# Patient Record
Sex: Female | Born: 1955 | Race: White | Hispanic: No | Marital: Married
Health system: Southern US, Community
[De-identification: ages and names within clinical notes are randomized; demographics above are authoritative.]

---

## 2011-09-22 ENCOUNTER — Ambulatory Visit: Payer: Self-pay | Admitting: Family Medicine

## 2012-11-04 IMAGING — US US EXTREM LOW VENOUS*R*
1 series · 14 of 24 positions shown · non-contrast
Comparison: none

REASON FOR EXAM: STAT CR [id number] Right leg pain and swelling Eval
for Bakers cyst of DVT
COMMENTS:

PROCEDURE:     CLE[facility] - CLELAND DOPPLER LOW EXTR RIGHT  - September 22, 2011  [DATE]
RESULT:     Comparison: None

[Series 1: us extrem low venous*right* · 0.09mm/px · 14 of 30 slices shown]
[im 1/30]
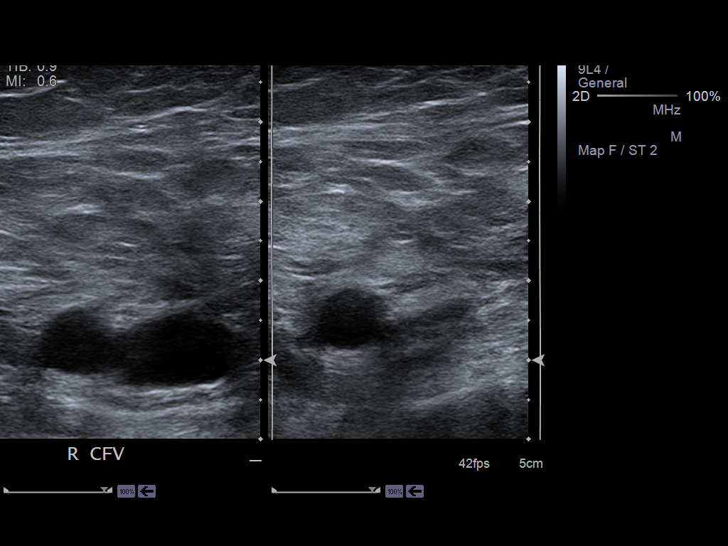
[im 3/30]
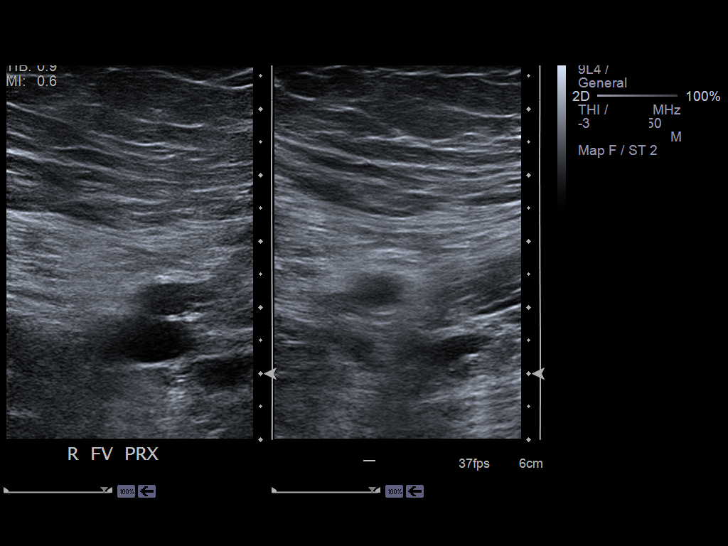
[im 6/30]
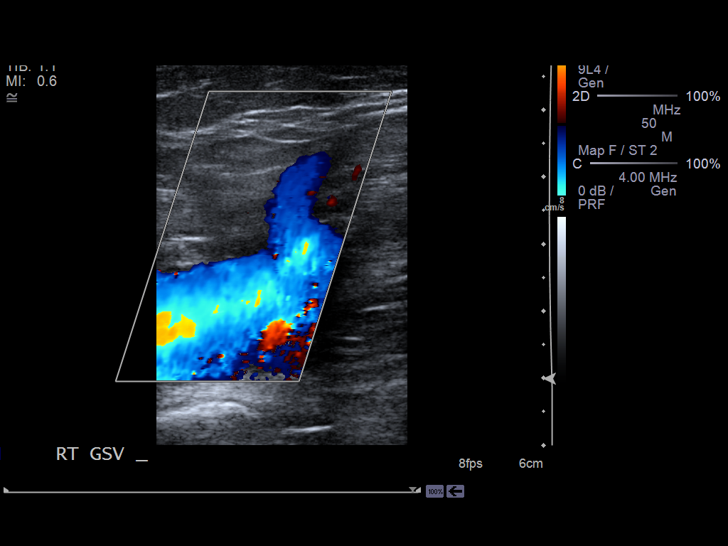
[im 8/30]
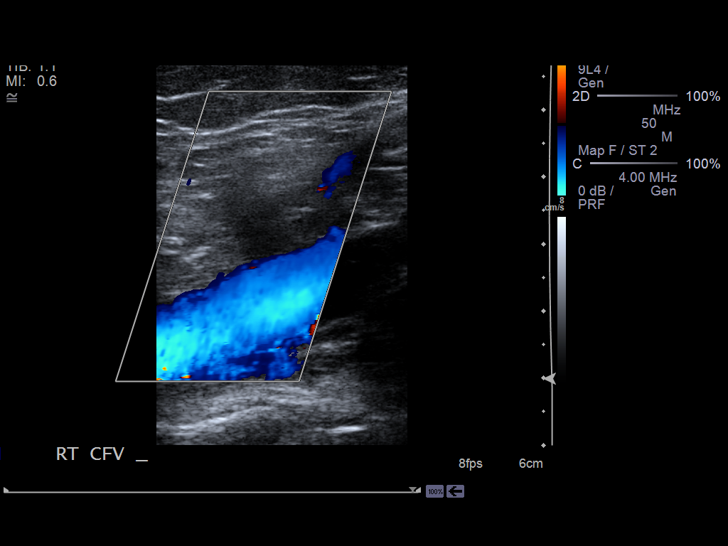
[im 9/30]
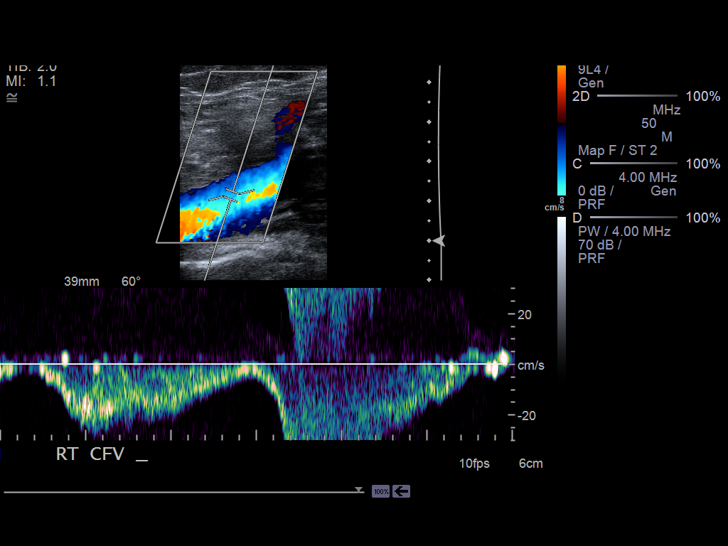
[im 12/30]
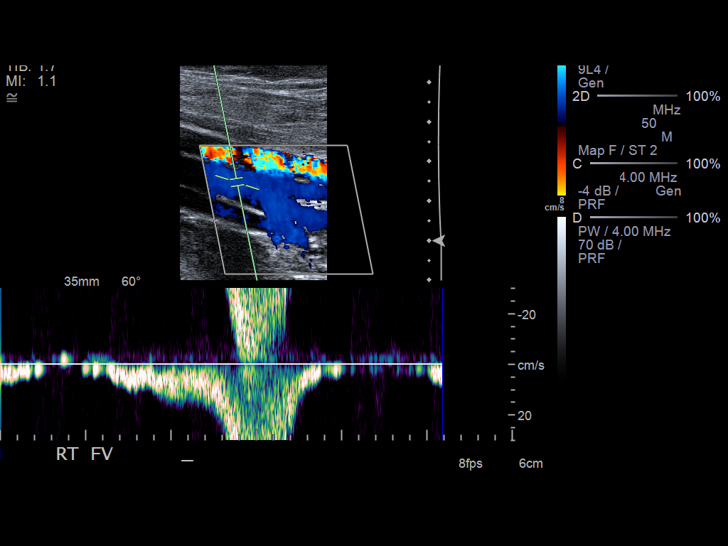
[im 14/30]
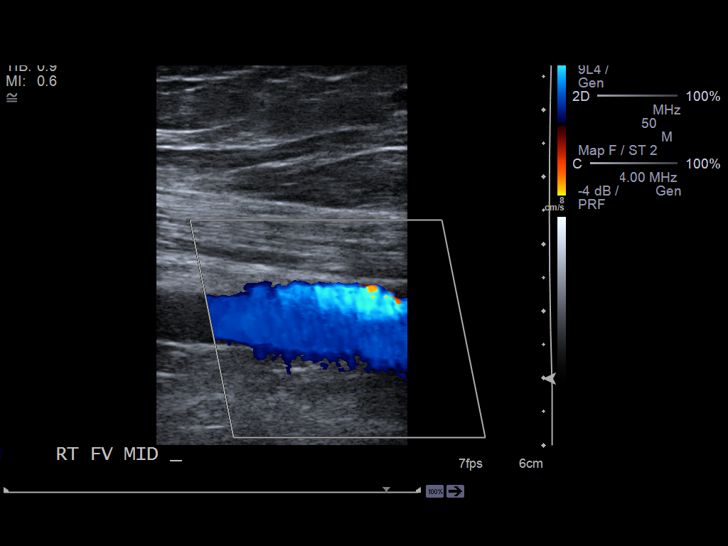
[im 16/30]
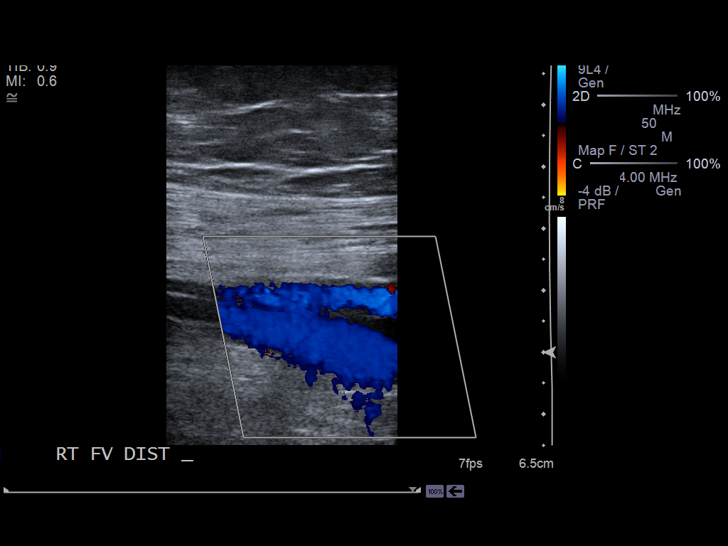
[im 18/30]
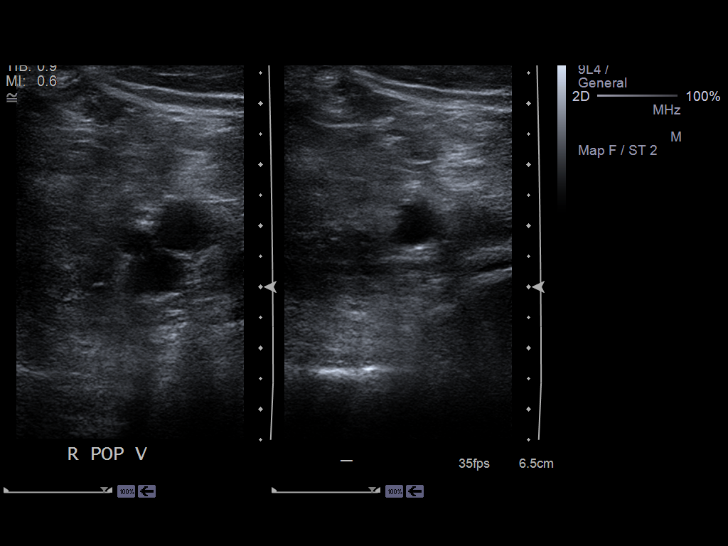
[im 21/30]
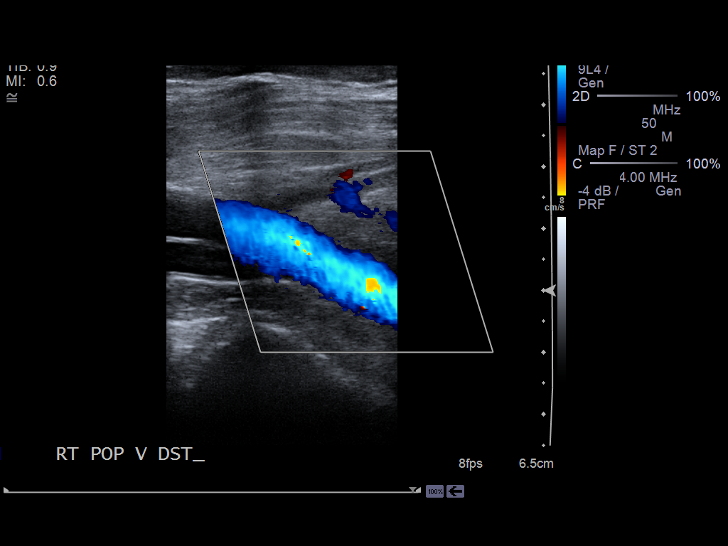
[im 23/30]
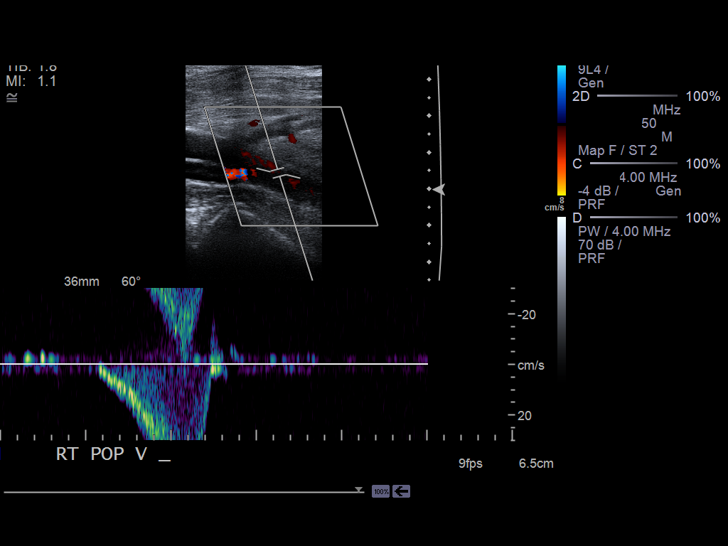
[im 24/30]
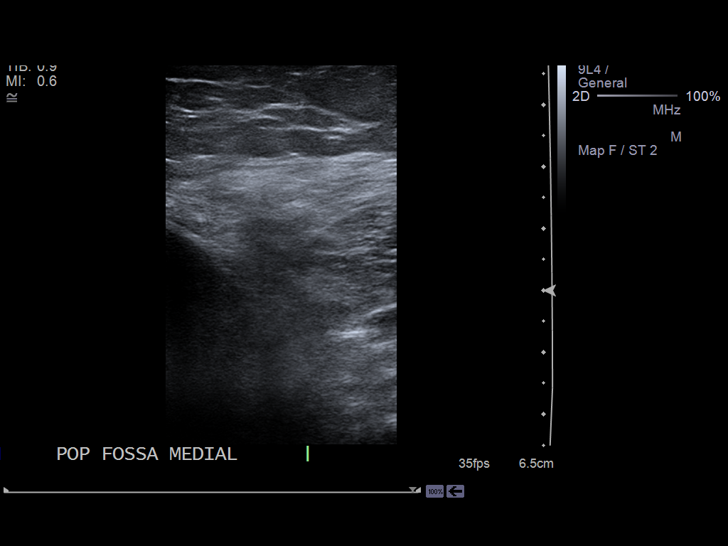
[im 27/30]
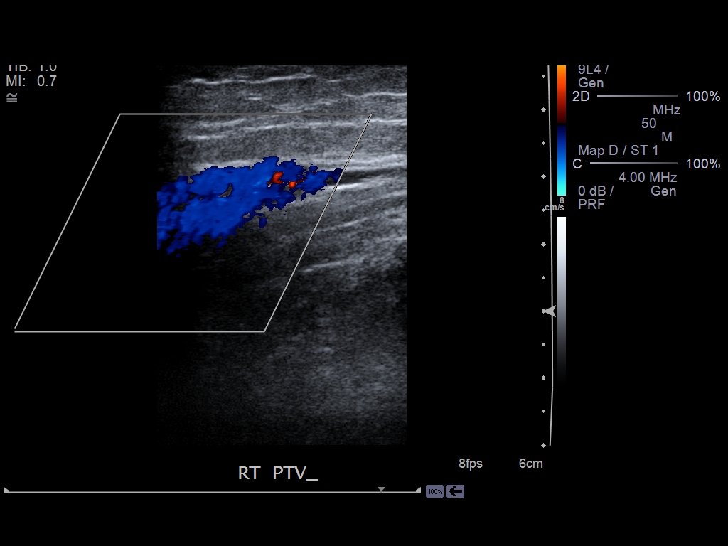
[im 30/30]
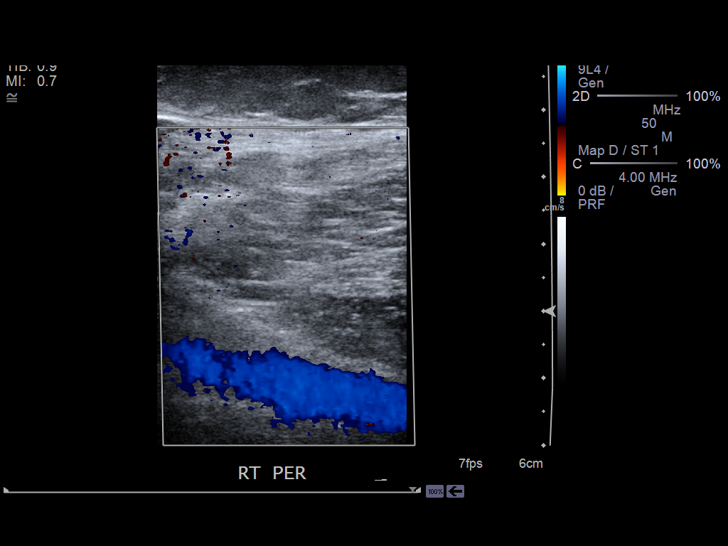

[14 of 24 positions shown; findings below may reference images not displayed]

FINDINGS: Multiple longitudinal and transverse gray-scale as well as color
and spectral Doppler images of the left lower extremity veins were obtained
from the common femoral veins through the popliteal veins.

The left common femoral, greater saphenous, femoral, popliteal veins, and
venous trifurcation are patent, demonstrating normal color-flow and
compressibility. No intraluminal thrombus is identified.There is normal
respiratory variation and augmentation demonstrated at all vein levels.
IMPRESSION: No evidence of DVT in the left lower extremity.

## 2021-07-07 DIAGNOSIS — H6123 Impacted cerumen, bilateral: Secondary | ICD-10-CM | POA: Diagnosis not present

## 2021-07-07 DIAGNOSIS — E782 Mixed hyperlipidemia: Secondary | ICD-10-CM | POA: Diagnosis not present

## 2021-07-07 DIAGNOSIS — E1165 Type 2 diabetes mellitus with hyperglycemia: Secondary | ICD-10-CM | POA: Diagnosis not present

## 2021-07-07 DIAGNOSIS — K219 Gastro-esophageal reflux disease without esophagitis: Secondary | ICD-10-CM | POA: Diagnosis not present

## 2021-07-07 DIAGNOSIS — E039 Hypothyroidism, unspecified: Secondary | ICD-10-CM | POA: Diagnosis not present

## 2021-07-07 DIAGNOSIS — M79671 Pain in right foot: Secondary | ICD-10-CM | POA: Diagnosis not present

## 2021-07-07 DIAGNOSIS — Z794 Long term (current) use of insulin: Secondary | ICD-10-CM | POA: Diagnosis not present

## 2021-07-14 DIAGNOSIS — L03116 Cellulitis of left lower limb: Secondary | ICD-10-CM | POA: Diagnosis not present

## 2021-08-31 DIAGNOSIS — Z794 Long term (current) use of insulin: Secondary | ICD-10-CM | POA: Diagnosis not present

## 2021-08-31 DIAGNOSIS — Z6831 Body mass index (BMI) 31.0-31.9, adult: Secondary | ICD-10-CM | POA: Diagnosis not present

## 2021-08-31 DIAGNOSIS — E1165 Type 2 diabetes mellitus with hyperglycemia: Secondary | ICD-10-CM | POA: Diagnosis not present

## 2021-08-31 DIAGNOSIS — E039 Hypothyroidism, unspecified: Secondary | ICD-10-CM | POA: Diagnosis not present

## 2021-08-31 DIAGNOSIS — H6121 Impacted cerumen, right ear: Secondary | ICD-10-CM | POA: Diagnosis not present

## 2021-08-31 DIAGNOSIS — E669 Obesity, unspecified: Secondary | ICD-10-CM | POA: Diagnosis not present

## 2021-08-31 DIAGNOSIS — E782 Mixed hyperlipidemia: Secondary | ICD-10-CM | POA: Diagnosis not present

## 2021-09-03 DIAGNOSIS — H6121 Impacted cerumen, right ear: Secondary | ICD-10-CM | POA: Diagnosis not present

## 2021-09-03 DIAGNOSIS — E1165 Type 2 diabetes mellitus with hyperglycemia: Secondary | ICD-10-CM | POA: Diagnosis not present

## 2021-09-03 DIAGNOSIS — Z794 Long term (current) use of insulin: Secondary | ICD-10-CM | POA: Diagnosis not present

## 2021-10-11 DIAGNOSIS — I158 Other secondary hypertension: Secondary | ICD-10-CM | POA: Diagnosis not present

## 2021-10-11 DIAGNOSIS — J0141 Acute recurrent pansinusitis: Secondary | ICD-10-CM | POA: Diagnosis not present

## 2021-11-30 DIAGNOSIS — R8290 Unspecified abnormal findings in urine: Secondary | ICD-10-CM | POA: Diagnosis not present

## 2021-11-30 DIAGNOSIS — R309 Painful micturition, unspecified: Secondary | ICD-10-CM | POA: Diagnosis not present

## 2021-11-30 DIAGNOSIS — R350 Frequency of micturition: Secondary | ICD-10-CM | POA: Diagnosis not present

## 2021-12-02 DIAGNOSIS — E039 Hypothyroidism, unspecified: Secondary | ICD-10-CM | POA: Diagnosis not present

## 2021-12-02 DIAGNOSIS — G4700 Insomnia, unspecified: Secondary | ICD-10-CM | POA: Diagnosis not present

## 2021-12-02 DIAGNOSIS — M79644 Pain in right finger(s): Secondary | ICD-10-CM | POA: Diagnosis not present

## 2021-12-02 DIAGNOSIS — E1165 Type 2 diabetes mellitus with hyperglycemia: Secondary | ICD-10-CM | POA: Diagnosis not present

## 2021-12-02 DIAGNOSIS — Z794 Long term (current) use of insulin: Secondary | ICD-10-CM | POA: Diagnosis not present

## 2021-12-02 DIAGNOSIS — N390 Urinary tract infection, site not specified: Secondary | ICD-10-CM | POA: Diagnosis not present

## 2022-03-05 DIAGNOSIS — E1165 Type 2 diabetes mellitus with hyperglycemia: Secondary | ICD-10-CM | POA: Diagnosis not present

## 2022-03-05 DIAGNOSIS — E039 Hypothyroidism, unspecified: Secondary | ICD-10-CM | POA: Diagnosis not present

## 2022-03-05 DIAGNOSIS — Z794 Long term (current) use of insulin: Secondary | ICD-10-CM | POA: Diagnosis not present

## 2022-03-05 DIAGNOSIS — E782 Mixed hyperlipidemia: Secondary | ICD-10-CM | POA: Diagnosis not present

## 2022-03-23 ENCOUNTER — Ambulatory Visit
Admission: EM | Admit: 2022-03-23 | Discharge: 2022-03-23 | Disposition: A | Discharge status: Home / Self Care | Payer: PPO

## 2022-03-23 DIAGNOSIS — B9789 Other viral agents as the cause of diseases classified elsewhere: Secondary | ICD-10-CM | POA: Diagnosis not present

## 2022-03-23 DIAGNOSIS — J019 Acute sinusitis, unspecified: Secondary | ICD-10-CM

## 2022-03-23 NOTE — Discharge Instructions (Addendum)
You have been diagnosed with a viral upper respiratory infection based on your symptoms and exam. Viral illnesses cannot be treated with antibiotics - they are self limiting - and you should find your symptoms resolving within a few days. Get plenty of rest and non-caffeinated fluids.  We recommend you use over-the-counter medications for symptom control including Tylenol or ibuprofen for fever, chills or body aches, and cold/cough medication.  Saline mist spray is helpful for removing excess mucus from your nose.  Room humidifiers are helpful to ease breathing at night. You might also find relief of nasal/sinus congestion symptoms by using a nasal decongestant such as Sudafed sinus (pseudoephedrine).  You will need to obtain this medication from behind the pharmacist counter.  Speak to the pharmacist to verify that you are not duplicating medications with other over-the-counter formulations that you may be using.   Follow up here or with your primary care provider if your symptoms are worsening or not improving.    

## 2022-03-23 NOTE — ED Provider Notes (Signed)
Renaldo Fiddler    CSN: [id number] Arrival date & time: 03/23/22  1416      History   Chief Complaint Chief Complaint  Patient presents with   Nasal Congestion   Facial Pain    HPI Yvette Lane is a 65 y.o. female.   HPI  Presents to UC with c/o nasal congestion and sinus pressure x 4 days.  She states that sometimes she feels like her head is going to explode.  PMH includes treatment for DM 2, currently not well-controlled but recently started [facility] which has helped her hyperglycemia symptoms.  History reviewed. No pertinent past medical history.  There are no problems to display for this patient.   History reviewed. No pertinent surgical history.  OB History   No obstetric history on file.      Home Medications    Prior to Admission medications   Medication Sig Start Date End Date Taking? Authorizing Provider  desonide (DESOWEN) 0.05 % cream Apply topically twice daily until rash is resolved. 01/28/14  Yes [provider]  doxycycline (VIBRA-TABS) 100 MG tablet Take 100 mg by mouth 2 (two) times daily. 10/11/21  Yes [provider]  fluconazole (DIFLUCAN) 150 MG tablet Take 150 mg by mouth once. 12/02/21  Yes [provider]  glipiZIDE (GLUCOTROL XL) 5 MG 24 hr tablet Take 5 mg by mouth daily. 12/05/21  Yes [provider]  hydrOXYzine (ATARAX) 10 MG tablet Take by mouth. 03/05/22 04/04/22 Yes [provider]  Insulin Glargine w/ Trans Port 100 UNIT/ML SOPN Inject into the skin. 08/31/21  Yes [provider]  LANTUS SOLOSTAR 100 UNIT/ML Solostar Pen SMARTSIG:35 Unit(s) SUB-Q Every Night 02/17/22  Yes [provider]  levothyroxine (SYNTHROID) 150 MCG tablet Take by mouth. 03/18/22 03/18/23 Yes [provider]  losartan (COZAAR) 25 MG tablet Take 1 tablet by mouth daily. 08/31/21  Yes [provider]  MOUNJARO 2.5 MG/0.5ML Pen Inject into the skin. 03/05/22  Yes [provider]  nitrofurantoin, macrocrystal-monohydrate, (MACROBID) 100 MG capsule Take 100 mg by mouth 2 (two) times daily. 03/05/22  Yes [provider]  pantoprazole (PROTONIX) 40 MG tablet Take by mouth. 08/31/21  Yes [provider]  sertraline (ZOLOFT) 50 MG tablet Take 1 tablet by mouth daily. 08/31/21  Yes [provider]  SYNTHROID 175 MCG tablet Take 175 mcg by mouth daily. 02/08/22  Yes [provider]  traZODone (DESYREL) 50 MG tablet  01/01/22  Yes [provider]  fluticasone (FLONASE) 50 MCG/ACT nasal spray fluticasone propionate 50 mcg/actuation nasal spray,suspension  SPRAY 2 SPRAYS INTO EACH NOSTRIL EVERY DAY    [provider]    Family History History reviewed. No pertinent family history.  Social History     Allergies   Ciprofloxacin and Penicillins   Review of Systems Review of Systems   Physical Exam Triage Vital Signs ED Triage Vitals  Enc Vitals Group     BP 03/23/22 1441 137/78     Pulse Rate 03/23/22 1441 83     Resp 03/23/22 1441 18     Temp 03/23/22 1441 99.1 F (37.3 C)     Temp Source 03/23/22 1441 Oral     SpO2 03/23/22 1441 95 %     Weight --      Height --      Head Circumference --      Peak Flow --      Pain Score 03/23/22 1440 0  Pain Loc --      Pain Edu? --      Excl. in GC? --    No data found.  Updated Vital Signs BP 137/78 (BP Location: Left Arm)   Pulse 83   Temp 99.1 F (37.3 C) (Oral)   Resp 18   LMP  (LMP Unknown)   SpO2 95%   Visual Acuity Right Eye Distance:   Left Eye Distance:   Bilateral Distance:    Right Eye Near:   Left Eye Near:    Bilateral Near:     Physical Exam Vitals reviewed.  Constitutional:      Appearance: Normal appearance.  Neurological:     General: No focal deficit present.     Mental Status: She is alert and oriented to person, place, and time.  Psychiatric:        Mood and Affect: Mood normal.        Behavior: Behavior  normal.      UC Treatments / Results  Labs (all labs ordered are listed, but only abnormal results are displayed) Labs Reviewed - No data to display  EKG   Radiology No results found.  Procedures Procedures (including critical care time)  Medications Ordered in UC Medications - No data to display  Initial Impression / Assessment and Plan / UC Course  I have reviewed the triage vital signs and the nursing notes.  Pertinent labs & imaging results that were available during my care of the patient were reviewed by me and considered in my medical decision making (see chart for details).   Given duration, most likely cause of her sinus symptoms is a viral sinusitis.  Discussed treatment options including over-the-counter medication.  Recommended Sudafed sinus which is available at the pharmacist counter.  We also talked about Flonase nasal spray which she states she already uses.  Given DM 2, prednisone course is likely contraindicated.  Patient states she would prefer to "have a antibiotic" which stimulated discussion for antibiotic stewardship, antibiotics do not treat sinus infections and would not be effective, and would more likely put her at risk of future resistant to antibiotics.  Patient seemed dissatisfied with this result and states "well I guess I wasted a trip here on ".  I expressed that I wished I could prescribe an antibiotic to help her symptoms, or could be of more assistance today, that I was very sorry.   Final Clinical Impressions(s) / UC Diagnoses   Final diagnoses:  None   Discharge Instructions   None    ED Prescriptions   None    PDMP not reviewed this encounter.   Charma Igo, [place] 03/23/22 1536

## 2022-03-23 NOTE — ED Triage Notes (Signed)
Pt. Presents to UC w/ c/o nasal congestion and sinus pressure for the past 4 days.  

## 2022-03-24 DIAGNOSIS — R051 Acute cough: Secondary | ICD-10-CM | POA: Diagnosis not present

## 2022-03-24 DIAGNOSIS — Z6831 Body mass index (BMI) 31.0-31.9, adult: Secondary | ICD-10-CM | POA: Diagnosis not present

## 2022-03-24 DIAGNOSIS — Z23 Encounter for immunization: Secondary | ICD-10-CM | POA: Diagnosis not present

## 2022-03-24 DIAGNOSIS — B3731 Acute candidiasis of vulva and vagina: Secondary | ICD-10-CM | POA: Diagnosis not present

## 2022-03-24 DIAGNOSIS — J0140 Acute pansinusitis, unspecified: Secondary | ICD-10-CM | POA: Diagnosis not present

## 2022-04-23 DIAGNOSIS — E039 Hypothyroidism, unspecified: Secondary | ICD-10-CM | POA: Diagnosis not present

## 2022-06-21 DIAGNOSIS — Z794 Long term (current) use of insulin: Secondary | ICD-10-CM | POA: Diagnosis not present

## 2022-06-21 DIAGNOSIS — T466X5A Adverse effect of antihyperlipidemic and antiarteriosclerotic drugs, initial encounter: Secondary | ICD-10-CM | POA: Diagnosis not present

## 2022-06-21 DIAGNOSIS — M25551 Pain in right hip: Secondary | ICD-10-CM | POA: Diagnosis not present

## 2022-06-21 DIAGNOSIS — E669 Obesity, unspecified: Secondary | ICD-10-CM | POA: Diagnosis not present

## 2022-06-21 DIAGNOSIS — Z1231 Encounter for screening mammogram for malignant neoplasm of breast: Secondary | ICD-10-CM | POA: Diagnosis not present

## 2022-06-21 DIAGNOSIS — M25552 Pain in left hip: Secondary | ICD-10-CM | POA: Diagnosis not present

## 2022-06-21 DIAGNOSIS — K219 Gastro-esophageal reflux disease without esophagitis: Secondary | ICD-10-CM | POA: Diagnosis not present

## 2022-06-21 DIAGNOSIS — M1611 Unilateral primary osteoarthritis, right hip: Secondary | ICD-10-CM | POA: Diagnosis not present

## 2022-06-21 DIAGNOSIS — G720 Drug-induced myopathy: Secondary | ICD-10-CM | POA: Diagnosis not present

## 2022-06-21 DIAGNOSIS — E1165 Type 2 diabetes mellitus with hyperglycemia: Secondary | ICD-10-CM | POA: Diagnosis not present

## 2022-06-21 DIAGNOSIS — M1612 Unilateral primary osteoarthritis, left hip: Secondary | ICD-10-CM | POA: Diagnosis not present

## 2022-06-21 DIAGNOSIS — Z780 Asymptomatic menopausal state: Secondary | ICD-10-CM | POA: Diagnosis not present

## 2022-06-21 DIAGNOSIS — E782 Mixed hyperlipidemia: Secondary | ICD-10-CM | POA: Diagnosis not present

## 2022-06-21 DIAGNOSIS — E039 Hypothyroidism, unspecified: Secondary | ICD-10-CM | POA: Diagnosis not present

## 2022-06-23 ENCOUNTER — Inpatient Hospital Stay
Admission: RE | Admit: 2022-06-23 | Discharge: 2022-06-23 | Disposition: A | Discharge status: Home / Self Care | Payer: Self-pay | Source: Ambulatory Visit | Attending: *Deleted | Admitting: *Deleted

## 2022-06-23 ENCOUNTER — Other Ambulatory Visit: Payer: Self-pay | Admitting: *Deleted

## 2022-06-23 DIAGNOSIS — Z1231 Encounter for screening mammogram for malignant neoplasm of breast: Secondary | ICD-10-CM

## 2022-06-24 ENCOUNTER — Other Ambulatory Visit: Payer: Self-pay | Admitting: Family Medicine

## 2022-06-24 DIAGNOSIS — Z1231 Encounter for screening mammogram for malignant neoplasm of breast: Secondary | ICD-10-CM

## 2022-08-04 DIAGNOSIS — E038 Other specified hypothyroidism: Secondary | ICD-10-CM | POA: Diagnosis not present

## 2022-09-01 DIAGNOSIS — R300 Dysuria: Secondary | ICD-10-CM | POA: Diagnosis not present

## 2022-11-02 DIAGNOSIS — E1165 Type 2 diabetes mellitus with hyperglycemia: Secondary | ICD-10-CM | POA: Diagnosis not present

## 2022-11-02 DIAGNOSIS — Z780 Asymptomatic menopausal state: Secondary | ICD-10-CM | POA: Diagnosis not present

## 2022-11-02 DIAGNOSIS — H6123 Impacted cerumen, bilateral: Secondary | ICD-10-CM | POA: Diagnosis not present

## 2022-11-02 DIAGNOSIS — E039 Hypothyroidism, unspecified: Secondary | ICD-10-CM | POA: Diagnosis not present

## 2022-11-02 DIAGNOSIS — Z0000 Encounter for general adult medical examination without abnormal findings: Secondary | ICD-10-CM | POA: Diagnosis not present

## 2022-11-02 DIAGNOSIS — K219 Gastro-esophageal reflux disease without esophagitis: Secondary | ICD-10-CM | POA: Diagnosis not present

## 2022-11-02 DIAGNOSIS — E669 Obesity, unspecified: Secondary | ICD-10-CM | POA: Diagnosis not present

## 2022-11-02 DIAGNOSIS — R300 Dysuria: Secondary | ICD-10-CM | POA: Diagnosis not present

## 2022-11-02 DIAGNOSIS — N898 Other specified noninflammatory disorders of vagina: Secondary | ICD-10-CM | POA: Diagnosis not present

## 2022-11-02 DIAGNOSIS — Z1283 Encounter for screening for malignant neoplasm of skin: Secondary | ICD-10-CM | POA: Diagnosis not present

## 2022-11-02 DIAGNOSIS — Z794 Long term (current) use of insulin: Secondary | ICD-10-CM | POA: Diagnosis not present

## 2022-11-02 DIAGNOSIS — Z1211 Encounter for screening for malignant neoplasm of colon: Secondary | ICD-10-CM | POA: Diagnosis not present

## 2022-11-02 DIAGNOSIS — E782 Mixed hyperlipidemia: Secondary | ICD-10-CM | POA: Diagnosis not present

## 2022-11-02 DIAGNOSIS — Z6831 Body mass index (BMI) 31.0-31.9, adult: Secondary | ICD-10-CM | POA: Diagnosis not present

## 2022-11-16 DIAGNOSIS — M8588 Other specified disorders of bone density and structure, other site: Secondary | ICD-10-CM | POA: Diagnosis not present

## 2022-12-10 DIAGNOSIS — L814 Other melanin hyperpigmentation: Secondary | ICD-10-CM | POA: Diagnosis not present

## 2022-12-10 DIAGNOSIS — L821 Other seborrheic keratosis: Secondary | ICD-10-CM | POA: Diagnosis not present

## 2022-12-10 DIAGNOSIS — D2262 Melanocytic nevi of left upper limb, including shoulder: Secondary | ICD-10-CM | POA: Diagnosis not present

## 2022-12-10 DIAGNOSIS — D2271 Melanocytic nevi of right lower limb, including hip: Secondary | ICD-10-CM | POA: Diagnosis not present

## 2022-12-10 DIAGNOSIS — L570 Actinic keratosis: Secondary | ICD-10-CM | POA: Diagnosis not present

## 2022-12-10 DIAGNOSIS — D2272 Melanocytic nevi of left lower limb, including hip: Secondary | ICD-10-CM | POA: Diagnosis not present

## 2022-12-10 DIAGNOSIS — D225 Melanocytic nevi of trunk: Secondary | ICD-10-CM | POA: Diagnosis not present

## 2022-12-10 DIAGNOSIS — L820 Inflamed seborrheic keratosis: Secondary | ICD-10-CM | POA: Diagnosis not present

## 2022-12-10 DIAGNOSIS — D485 Neoplasm of uncertain behavior of skin: Secondary | ICD-10-CM | POA: Diagnosis not present

## 2022-12-10 DIAGNOSIS — D2261 Melanocytic nevi of right upper limb, including shoulder: Secondary | ICD-10-CM | POA: Diagnosis not present

## 2022-12-24 DIAGNOSIS — E039 Hypothyroidism, unspecified: Secondary | ICD-10-CM | POA: Diagnosis not present

## 2023-07-07 DIAGNOSIS — E66811 Obesity, class 1: Secondary | ICD-10-CM | POA: Diagnosis not present

## 2023-07-07 DIAGNOSIS — E782 Mixed hyperlipidemia: Secondary | ICD-10-CM | POA: Diagnosis not present

## 2023-07-07 DIAGNOSIS — E039 Hypothyroidism, unspecified: Secondary | ICD-10-CM | POA: Diagnosis not present

## 2023-07-07 DIAGNOSIS — K219 Gastro-esophageal reflux disease without esophagitis: Secondary | ICD-10-CM | POA: Diagnosis not present

## 2023-07-07 DIAGNOSIS — Z6830 Body mass index (BMI) 30.0-30.9, adult: Secondary | ICD-10-CM | POA: Diagnosis not present

## 2023-07-07 DIAGNOSIS — E1165 Type 2 diabetes mellitus with hyperglycemia: Secondary | ICD-10-CM | POA: Diagnosis not present

## 2023-07-07 DIAGNOSIS — R197 Diarrhea, unspecified: Secondary | ICD-10-CM | POA: Diagnosis not present

## 2023-07-07 DIAGNOSIS — Z794 Long term (current) use of insulin: Secondary | ICD-10-CM | POA: Diagnosis not present

## 2023-07-09 DIAGNOSIS — Z1211 Encounter for screening for malignant neoplasm of colon: Secondary | ICD-10-CM | POA: Diagnosis not present

## 2023-07-14 LAB — COLOGUARD: COLOGUARD: POSITIVE — AB

## 2023-07-14 LAB — EXTERNAL GENERIC LAB PROCEDURE: COLOGUARD: POSITIVE — AB

## 2023-07-25 ENCOUNTER — Telehealth: Payer: Self-pay

## 2023-07-25 NOTE — Telephone Encounter (Signed)
 The patient called to schedule an appointment at our office. I informed her that we currently do not have any available appointments for new patients. I offered to add her to our call-back list and assured her that we will contact her as soon as an appointment becomes available.

## 2023-08-08 DIAGNOSIS — K219 Gastro-esophageal reflux disease without esophagitis: Secondary | ICD-10-CM | POA: Diagnosis not present

## 2023-08-08 DIAGNOSIS — Z794 Long term (current) use of insulin: Secondary | ICD-10-CM | POA: Diagnosis not present

## 2023-08-08 DIAGNOSIS — R1314 Dysphagia, pharyngoesophageal phase: Secondary | ICD-10-CM | POA: Diagnosis not present

## 2023-08-08 DIAGNOSIS — R195 Other fecal abnormalities: Secondary | ICD-10-CM | POA: Diagnosis not present

## 2023-08-08 DIAGNOSIS — E1165 Type 2 diabetes mellitus with hyperglycemia: Secondary | ICD-10-CM | POA: Diagnosis not present

## 2023-08-15 DIAGNOSIS — R350 Frequency of micturition: Secondary | ICD-10-CM | POA: Diagnosis not present

## 2023-08-15 DIAGNOSIS — R309 Painful micturition, unspecified: Secondary | ICD-10-CM | POA: Diagnosis not present

## 2023-09-16 ENCOUNTER — Ambulatory Visit: Payer: Self-pay

## 2023-09-16 DIAGNOSIS — Z1211 Encounter for screening for malignant neoplasm of colon: Secondary | ICD-10-CM | POA: Diagnosis not present

## 2023-09-16 DIAGNOSIS — K635 Polyp of colon: Secondary | ICD-10-CM | POA: Diagnosis not present

## 2023-09-16 DIAGNOSIS — D125 Benign neoplasm of sigmoid colon: Secondary | ICD-10-CM | POA: Diagnosis not present

## 2023-09-16 DIAGNOSIS — K5730 Diverticulosis of large intestine without perforation or abscess without bleeding: Secondary | ICD-10-CM | POA: Diagnosis not present

## 2023-09-16 DIAGNOSIS — R1314 Dysphagia, pharyngoesophageal phase: Secondary | ICD-10-CM | POA: Diagnosis not present

## 2023-09-16 DIAGNOSIS — R195 Other fecal abnormalities: Secondary | ICD-10-CM | POA: Diagnosis not present

## 2023-11-01 DIAGNOSIS — Z6830 Body mass index (BMI) 30.0-30.9, adult: Secondary | ICD-10-CM | POA: Diagnosis not present

## 2023-11-01 DIAGNOSIS — E66811 Obesity, class 1: Secondary | ICD-10-CM | POA: Diagnosis not present

## 2023-11-01 DIAGNOSIS — E1165 Type 2 diabetes mellitus with hyperglycemia: Secondary | ICD-10-CM | POA: Diagnosis not present

## 2023-11-01 DIAGNOSIS — Z794 Long term (current) use of insulin: Secondary | ICD-10-CM | POA: Diagnosis not present

## 2023-11-01 DIAGNOSIS — E039 Hypothyroidism, unspecified: Secondary | ICD-10-CM | POA: Diagnosis not present

## 2023-11-01 DIAGNOSIS — E782 Mixed hyperlipidemia: Secondary | ICD-10-CM | POA: Diagnosis not present

## 2023-11-08 DIAGNOSIS — E039 Hypothyroidism, unspecified: Secondary | ICD-10-CM | POA: Diagnosis not present

## 2023-11-08 DIAGNOSIS — Z0000 Encounter for general adult medical examination without abnormal findings: Secondary | ICD-10-CM | POA: Diagnosis not present

## 2023-11-08 DIAGNOSIS — E1165 Type 2 diabetes mellitus with hyperglycemia: Secondary | ICD-10-CM | POA: Diagnosis not present

## 2023-11-08 DIAGNOSIS — Z794 Long term (current) use of insulin: Secondary | ICD-10-CM | POA: Diagnosis not present

## 2023-11-08 DIAGNOSIS — Z1331 Encounter for screening for depression: Secondary | ICD-10-CM | POA: Diagnosis not present

## 2023-11-08 DIAGNOSIS — Z1231 Encounter for screening mammogram for malignant neoplasm of breast: Secondary | ICD-10-CM | POA: Diagnosis not present

## 2024-01-05 DIAGNOSIS — E1165 Type 2 diabetes mellitus with hyperglycemia: Secondary | ICD-10-CM | POA: Diagnosis not present

## 2024-01-05 DIAGNOSIS — Z794 Long term (current) use of insulin: Secondary | ICD-10-CM | POA: Diagnosis not present

## 2024-01-05 DIAGNOSIS — L02419 Cutaneous abscess of limb, unspecified: Secondary | ICD-10-CM | POA: Diagnosis not present

## 2024-01-05 DIAGNOSIS — L03119 Cellulitis of unspecified part of limb: Secondary | ICD-10-CM | POA: Diagnosis not present

## 2024-01-05 DIAGNOSIS — E039 Hypothyroidism, unspecified: Secondary | ICD-10-CM | POA: Diagnosis not present

## 2024-01-09 DIAGNOSIS — Z23 Encounter for immunization: Secondary | ICD-10-CM | POA: Diagnosis not present

## 2024-01-09 DIAGNOSIS — S81801D Unspecified open wound, right lower leg, subsequent encounter: Secondary | ICD-10-CM | POA: Diagnosis not present

## 2024-01-09 DIAGNOSIS — H6121 Impacted cerumen, right ear: Secondary | ICD-10-CM | POA: Diagnosis not present

## 2024-01-09 DIAGNOSIS — L03115 Cellulitis of right lower limb: Secondary | ICD-10-CM | POA: Diagnosis not present

## 2024-01-12 DIAGNOSIS — S81801D Unspecified open wound, right lower leg, subsequent encounter: Secondary | ICD-10-CM | POA: Diagnosis not present

## 2024-02-14 DIAGNOSIS — Z794 Long term (current) use of insulin: Secondary | ICD-10-CM | POA: Diagnosis not present

## 2024-02-14 DIAGNOSIS — E039 Hypothyroidism, unspecified: Secondary | ICD-10-CM | POA: Diagnosis not present

## 2024-02-14 DIAGNOSIS — E1165 Type 2 diabetes mellitus with hyperglycemia: Secondary | ICD-10-CM | POA: Diagnosis not present

## 2024-02-24 DIAGNOSIS — D2271 Melanocytic nevi of right lower limb, including hip: Secondary | ICD-10-CM | POA: Diagnosis not present

## 2024-02-24 DIAGNOSIS — L820 Inflamed seborrheic keratosis: Secondary | ICD-10-CM | POA: Diagnosis not present

## 2024-02-24 DIAGNOSIS — D225 Melanocytic nevi of trunk: Secondary | ICD-10-CM | POA: Diagnosis not present

## 2024-02-24 DIAGNOSIS — D2272 Melanocytic nevi of left lower limb, including hip: Secondary | ICD-10-CM | POA: Diagnosis not present

## 2024-02-24 DIAGNOSIS — D2261 Melanocytic nevi of right upper limb, including shoulder: Secondary | ICD-10-CM | POA: Diagnosis not present

## 2024-02-24 DIAGNOSIS — L814 Other melanin hyperpigmentation: Secondary | ICD-10-CM | POA: Diagnosis not present

## 2024-02-24 DIAGNOSIS — D0461 Carcinoma in situ of skin of right upper limb, including shoulder: Secondary | ICD-10-CM | POA: Diagnosis not present

## 2024-02-24 DIAGNOSIS — D485 Neoplasm of uncertain behavior of skin: Secondary | ICD-10-CM | POA: Diagnosis not present

## 2024-02-24 DIAGNOSIS — D2262 Melanocytic nevi of left upper limb, including shoulder: Secondary | ICD-10-CM | POA: Diagnosis not present

## 2024-02-24 DIAGNOSIS — L570 Actinic keratosis: Secondary | ICD-10-CM | POA: Diagnosis not present

## 2024-03-08 DIAGNOSIS — L570 Actinic keratosis: Secondary | ICD-10-CM | POA: Diagnosis not present

## 2024-03-08 DIAGNOSIS — D0461 Carcinoma in situ of skin of right upper limb, including shoulder: Secondary | ICD-10-CM | POA: Diagnosis not present
